# Patient Record
Sex: Female | Born: 1965 | Hispanic: Yes | Marital: Married | Smoking: Never smoker
Health system: Southern US, Community
[De-identification: ages and names within clinical notes are randomized; demographics above are authoritative.]

## PROBLEM LIST (undated history)

## (undated) DIAGNOSIS — Z780 Asymptomatic menopausal state: Secondary | ICD-10-CM

## (undated) DIAGNOSIS — J45909 Unspecified asthma, uncomplicated: Secondary | ICD-10-CM

## (undated) HISTORY — DX: Unspecified asthma, uncomplicated: J45.909

## (undated) HISTORY — PX: ABDOMINOPLASTY: SUR9

## (undated) HISTORY — DX: Asymptomatic menopausal state: Z78.0

---

## 2012-02-10 ENCOUNTER — Other Ambulatory Visit (HOSPITAL_COMMUNITY): Payer: Self-pay | Admitting: Vascular Surgery

## 2012-02-10 ENCOUNTER — Ambulatory Visit (HOSPITAL_COMMUNITY)
Admission: RE | Admit: 2012-02-10 | Discharge: 2012-02-10 | Disposition: A | Discharge status: Home / Self Care | Payer: Managed Care, Other (non HMO) | Source: Ambulatory Visit | Attending: Internal Medicine | Admitting: Internal Medicine

## 2012-02-10 DIAGNOSIS — R42 Dizziness and giddiness: Secondary | ICD-10-CM

## 2012-02-10 DIAGNOSIS — R55 Syncope and collapse: Secondary | ICD-10-CM

## 2012-02-10 NOTE — Progress Notes (Signed)
 [facility]   2D echo completed 02/10/2012.   Veda Canning, RDCS

## 2012-02-10 NOTE — Progress Notes (Signed)
Carotid Doppler completed. Olivia Maynard

## 2012-03-11 ENCOUNTER — Telehealth: Payer: Self-pay | Admitting: Genetic Counselor

## 2012-03-11 NOTE — Telephone Encounter (Signed)
S/W pt in re genetic appt 02/24 @ 1 w/Karen [employer]ell [employer].  Welcome packet mailed.

## 2012-04-03 ENCOUNTER — Other Ambulatory Visit: Payer: Self-pay | Admitting: Internal Medicine

## 2012-04-03 DIAGNOSIS — R5383 Other fatigue: Secondary | ICD-10-CM

## 2012-04-07 ENCOUNTER — Ambulatory Visit
Admission: RE | Admit: 2012-04-07 | Discharge: 2012-04-07 | Disposition: A | Discharge status: Home / Self Care | Payer: Managed Care, Other (non HMO) | Source: Ambulatory Visit | Attending: Internal Medicine | Admitting: Internal Medicine

## 2012-04-07 DIAGNOSIS — R5383 Other fatigue: Secondary | ICD-10-CM

## 2012-05-01 ENCOUNTER — Ambulatory Visit (HOSPITAL_BASED_OUTPATIENT_CLINIC_OR_DEPARTMENT_OTHER): Payer: Managed Care, Other (non HMO) | Admitting: Genetic Counselor

## 2012-05-01 ENCOUNTER — Other Ambulatory Visit: Payer: Managed Care, Other (non HMO)

## 2012-05-01 DIAGNOSIS — Z809 Family history of malignant neoplasm, unspecified: Secondary | ICD-10-CM

## 2012-05-01 DIAGNOSIS — Z803 Family history of malignant neoplasm of breast: Secondary | ICD-10-CM

## 2012-05-02 ENCOUNTER — Encounter: Payer: Self-pay | Admitting: Genetic Counselor

## 2012-05-02 NOTE — Progress Notes (Signed)
Dr.  Juliene Pina requested a consultation for genetic counseling and risk assessment for Olivia Maynard, a 47 y.o. female, for discussion of her family history of breast, throat and prostate cancer. She presents to clinic today to discuss the possibility of a genetic predisposition to cancer, and to further clarify her risks, as well as her family members' risks for cancer.   HISTORY OF PRESENT ILLNESS: Olivia Maynard is a 47 y.o. female with no personal history of cancer.    History reviewed. No pertinent past medical history.  History reviewed. No pertinent past surgical history.  History  Substance Use Topics  . Smoking status: Never Smoker   . Smokeless tobacco: Not on file  . Alcohol Use: Yes     Comment: socially    REPRODUCTIVE HISTORY AND PERSONAL RISK ASSESSMENT FACTORS: Menarche was at age 27.   Premenopausal Uterus Intact: Yes Ovaries Intact: Yes G2P2A0 , first live birth at age 74  She has previously undergone treatment for infertility.   OCP use for 2 years   She has not used HRT in the past.    FAMILY HISTORY:  We obtained a detailed, 4-generation family history.  Significant diagnoses are listed below: Family History  Problem Relation Age of Onset  . Breast cancer Sister 78  . Cancer Sister     cancer of sternum, and one other unknown cancer  . Breast cancer Paternal Aunt 40  . Prostate cancer Paternal Grandfather   . Throat cancer Other     dx late 20s-30s  . Breast cancer Paternal Aunt     dx in her 32s; bilateral cancer  . Breast cancer Paternal Aunt   . Breast cancer Paternal Aunt 47  . Breast cancer Cousin     dx in her 82s; paternal cousin  The patient has one maternal half brother and sister, and three full sisters and two full brothers.  Her oldest full sister was diagnosed with breast cancer at age 77.  She is currently 7 and since that time she has also had a cancer on her sternum and one other cancer.  The patients parents are both alive and cancer  free.  Her father had four sisters, all of whom had breast cancer.  One had breast cancer in her 19s and died at 34, another had breast cancer diagnosed twice in her 30s and died in her 68s, a third had breast cancer in her 57s and died and the fourth had breast cancer at 29 and is living.  This last sister had a daughter with breast cancer in her 45s.  The patient's paternal grandfather had prostate cancer and died at 33.  The patient's mother had a full sister and two maternal half brothers.  One brother had an unknown cancer and the sister had a daughter who is currently affected with an unknown cancer.  There is no other cancer reported in the family.  Patient's maternal ancestors are of [place] descent, and paternal ancestors are of Spanish descent. There is no reported Ashkenazi Jewish ancestry. There is no  known consanguinity.  GENETIC COUNSELING RISK ASSESSMENT, DISCUSSION, AND SUGGESTED FOLLOW UP: We reviewed the natural history and genetic etiology of sporadic, familial and hereditary cancer syndromes.  About 5-10% of breast cancer is hereditary.  Of this, about 85% is the result of a BRCA1 or BRCA2 mutation.  We reviewed the red flags of hereditary cancer syndromes and the dominant inheritance patterns.  If the BRCA testing is negative, we discussed that  we could be testing for the wrong gene.  We discussed gene panels, and that several cancer genes that are associated with different cancers can be tested at the same time.  Because of the multiple individuals in the family with breast cancer, we will consider one of the panel tests if she is negative for BRCA mutations. We discussed that if the patient tests negative, her sister needs to consider having her own testing.  The patient's family history of breast and prostate cancer is suggestive of the following possible diagnosis: hereditary cancer syndrome  We discussed that identification of a hereditary cancer syndrome may help her care  providers tailor the patients medical management. If a mutation indicating a hereditary cancer syndrome is detected in this case, the [employer] recommendations would include increased cancer surveillance and possible prophylactic surgery. If a mutation is detected, the patient will be referred back to the referring provider and to any additional appropriate care providers to discuss the relevant options.   If a mutation is not found in the patient, cancer surveillance options would be discussed for the patient according to the appropriate standard National Comprehensive Cancer Network and American Cancer Society guidelines, with consideration of their personal and family history risk factors. In this case, the patient will be referred back to their care providers for discussions of management.   In order to estimate her chance of having a BRCA mutation, we used statistical models (Tyrer Cusik and [employer]) and laboratory data that take into account her personal medical history, family history and ancestry.  Because each model is different, there can be a lot of variability in the risks they give.  Therefore, these numbers must be considered a rough range and not a precise risk of having a BRCA mutation.  These models estimate that she has approximately a 18% chance of having a mutation. Based on this assessment of her family and personal history, genetic testing is recommended.  Based on the patient's personal and family history, statistical models (Tyrer Cusik)  and literature data were used to estimate her risk of developing breast cancer. These estimate her lifetime risk of developing breast cancer to be approximately 35.1%. This estimation does not take into account any genetic testing results.   After considering the risks, benefits, and limitations, the patient provided informed consent for  the following  Testing: Breast/Ovarian cancer panel through [facility].   Per the  patient's request, we will contact her by telephone to discuss these results. A follow up genetic counseling visit will be scheduled if indicated.  The patient was seen for a total of 60 minutes, greater than 50% of which was spent face-to-face counseling.  This plan is being carried out per Dr. Camillia Herter recommendations.  This note will also be sent to the referring provider via the electronic medical record. The patient will be supplied with a summary of this genetic counseling discussion as well as educational information on the discussed hereditary cancer syndromes following the conclusion of their visit.   Patient was discussed with Dr. Drue Second.    _______________________________________________________________________ For Office Staff:  Number of people involved in session: 2 Was an Intern/ student involved with case: no

## 2012-06-15 ENCOUNTER — Telehealth: Payer: Self-pay | Admitting: Genetic Counselor

## 2012-06-15 NOTE — Telephone Encounter (Signed)
Revealed negative genetic testing.    

## 2012-06-22 ENCOUNTER — Encounter: Payer: Self-pay | Admitting: Genetic Counselor

## 2013-08-13 ENCOUNTER — Encounter (HOSPITAL_COMMUNITY): Payer: Self-pay | Admitting: Emergency Medicine

## 2013-08-13 ENCOUNTER — Emergency Department (HOSPITAL_COMMUNITY)
Admission: EM | Admit: 2013-08-13 | Discharge: 2013-08-13 | Disposition: A | Discharge status: Home / Self Care | Payer: Managed Care, Other (non HMO) | Attending: Emergency Medicine | Admitting: Emergency Medicine

## 2013-08-13 DIAGNOSIS — Y9289 Other specified places as the place of occurrence of the external cause: Secondary | ICD-10-CM | POA: Insufficient documentation

## 2013-08-13 DIAGNOSIS — M549 Dorsalgia, unspecified: Secondary | ICD-10-CM

## 2013-08-13 DIAGNOSIS — Y9389 Activity, other specified: Secondary | ICD-10-CM | POA: Insufficient documentation

## 2013-08-13 DIAGNOSIS — IMO0002 Reserved for concepts with insufficient information to code with codable children: Secondary | ICD-10-CM | POA: Insufficient documentation

## 2013-08-13 DIAGNOSIS — X500XXA Overexertion from strenuous movement or load, initial encounter: Secondary | ICD-10-CM | POA: Insufficient documentation

## 2013-08-13 MED ORDER — HYDROMORPHONE HCL PF 1 MG/ML IJ SOLN
1.0000 mg | Freq: Once | INTRAMUSCULAR | Status: AC
  Administered 2013-08-13: 1 mg via INTRAMUSCULAR
  Filled 2013-08-13: qty 1

## 2013-08-13 MED ORDER — DIAZEPAM 5 MG PO TABS
5.0000 mg | ORAL_TABLET | Freq: Once | ORAL | Status: AC
  Administered 2013-08-13: 5 mg via ORAL
  Filled 2013-08-13: qty 1

## 2013-08-13 MED ORDER — ONDANSETRON 4 MG PO TBDP
4.0000 mg | ORAL_TABLET | Freq: Once | ORAL | Status: AC
  Administered 2013-08-13: 4 mg via ORAL
  Filled 2013-08-13: qty 1

## 2013-08-13 MED ORDER — HYDROCODONE-ACETAMINOPHEN 5-325 MG PO TABS: ORAL_TABLET | ORAL | Status: DC

## 2013-08-13 MED ORDER — METHOCARBAMOL 500 MG PO TABS: 1000.0000 mg | ORAL_TABLET | Freq: Four times a day (QID) | ORAL | Status: DC

## 2013-08-13 MED ORDER — NAPROXEN 500 MG PO TABS: 500.0000 mg | ORAL_TABLET | Freq: Two times a day (BID) | ORAL | Status: DC

## 2013-08-13 NOTE — ED Notes (Signed)
Josh PA bedside 

## 2013-08-13 NOTE — ED Notes (Signed)
Pt was on church on last Sunday and sat down and felt something pop in back, was able to tolerate this past week with ibuprofen but this morning she was bending down to pick up a basket and felt something pop again and brought her down to her knees, has tried taking meds without relief.

## 2013-08-13 NOTE — ED Notes (Signed)
Pt alert, nad, resp even unlabored, skin pwd, denies needs, ambulates to discharge 

## 2013-08-13 NOTE — ED Provider Notes (Signed)
CSN: [id number]     Arrival date & time 08/13/13  [id number] History   First MD Initiated Contact with Patient 08/13/13 1009     Chief Complaint  Patient presents with  . Back Pain     (Consider location/radiation/quality/duration/timing/severity/associated sxs/prior Treatment) HPI Comments: Patient presents with complaint of lower back pain. Patient began having back pain 8 days ago when she felt a pop after sitting down in church. Patient had mild pain throughout the week controlled with Advil. This morning the patient's bent over to pick up some laundry when she felt another pop and had acute onset of severe pain. Pain radiates from her lower back around her sides. She does not have radiation into her legs. No red flags of lower back pain. She took Advil this morning without relief. The onset of this condition was acute. The course is constant. Aggravating factors: movement. Alleviating factors: none.    Patient is a 48 y.o. female presenting with back pain. The history is provided by the patient.  Back Pain Associated symptoms: no dysuria, no fever, no numbness, no pelvic pain and no weakness     History reviewed. No pertinent past medical history. History reviewed. No pertinent past surgical history. Family History  Problem Relation Age of Onset  . Breast cancer Sister 6817  . Cancer Sister     cancer of sternum, and one other unknown cancer  . Breast cancer Paternal Aunt 40  . Prostate cancer Paternal Grandfather   . Throat cancer Other     dx late 20s-30s  . Breast cancer Paternal Aunt     dx in her 6850s; bilateral cancer  . Breast cancer Paternal Aunt   . Breast cancer Paternal Aunt 975  . Breast cancer Cousin     dx in her 2750s; paternal cousin   History  Substance Use Topics  . Smoking status: Never Smoker   . Smokeless tobacco: Not on file  . Alcohol Use: Yes     Comment: socially   OB History   Grav Para Term Preterm Abortions TAB SAB Ect Mult Living                  Review of Systems  Constitutional: Negative for fever and unexpected weight change.  Gastrointestinal: Negative for constipation.       Negative for fecal incontinence.   Genitourinary: Negative for dysuria, hematuria, flank pain, vaginal bleeding, vaginal discharge and pelvic pain.       Negative for urinary incontinence or retention.  Musculoskeletal: Positive for back pain.  Neurological: Negative for weakness and numbness.       Denies saddle paresthesias.   Allergies  Review of patient's allergies indicates no known allergies.  Home Medications   Prior to Admission medications   Not on File   BP 120/73  Pulse 80  Temp(Src) 97.7 F (36.5 C) (Oral)  Resp 20  SpO2 100%  LMP 07/12/2013  Physical Exam  Nursing note and vitals reviewed. Constitutional: She appears well-developed and well-nourished. She appears distressed.  Patient crying in pain  HENT:  Head: Normocephalic and atraumatic.  Eyes: Conjunctivae are normal.  Neck: Normal range of motion. Neck supple.  Pulmonary/Chest: Effort normal.  Abdominal: Soft. There is no tenderness. There is no CVA tenderness.  Musculoskeletal: Normal range of motion. She exhibits tenderness.       Cervical back: Normal.       Thoracic back: Normal.       Lumbar back: She exhibits tenderness. She  exhibits normal range of motion and no bony tenderness.  No step-off noted with palpation of spine.   Neurological: She is alert. She has normal strength and normal reflexes. No sensory deficit.  5/5 strength in entire lower extremities bilaterally. No sensation deficit.   Skin: Skin is warm and dry. No rash noted.  Psychiatric: She has a normal mood and affect.    ED Course  Procedures (including critical care time) Labs Review Labs Reviewed - No data to display  Imaging Review No results found.   EKG Interpretation None      10:35 AM Patient seen and examined. Work-up initiated. Given severity of symptoms, IM dilaudid and  PO valium ordered. Medications ordered.   Vital signs reviewed and are as follows: Filed Vitals:   08/13/13 0956  BP: 120/73  Pulse: 80  Temp: 97.7 F (36.5 C)  Resp: 20   11:31 AM Symptoms much improved. LE neurovascularly intact with normal strength and sensation.   No red flag s/s of low back pain. Patient was counseled on back pain precautions and told to do activity as tolerated but do not lift, push, or pull heavy objects more than 10 pounds for the next week.  Patient counseled to use ice or heat on back for no longer than 15 minutes every hour.   Patient prescribed muscle relaxer and counseled on proper use of muscle relaxant medication.    Patient prescribed narcotic pain medicine and counseled on proper use of narcotic pain medications. Counseled not to combine this medication with others containing tylenol.   Urged patient not to drink alcohol, drive, or perform any other activities that requires focus while taking either of these medications.  Patient urged to follow-up with PCP if pain does not improve with treatment and rest or if pain becomes recurrent. Urged to return with worsening severe pain, loss of bowel or bladder control, trouble walking.   The patient verbalizes understanding and agrees with the plan.   MDM   Final diagnoses:  Back pain   Patient with back pain. No neurological deficits. Patient is ambulatory. No warning symptoms of back pain including: loss of bowel or bladder control, night sweats, waking from sleep with back pain, unexplained fevers or weight loss, h/o cancer, IVDU, recent trauma. No concern for cauda equina, epidural abscess, or other serious cause of back pain. Conservative measures such as rest, ice/heat and pain medicine indicated with PCP follow-up if no improvement with conservative management.     Renne Crigler, PA-C 08/13/13 1132

## 2013-08-13 NOTE — Discharge Instructions (Signed)
Please read and follow all provided instructions.  Your diagnoses today include:  1. Back pain     Tests performed today include:  Vital signs - see below for your results today  Medications prescribed:   Vicodin (hydrocodone/acetaminophen) - narcotic pain medication  DO NOT drive or perform any activities that require you to be awake and alert because this medicine can make you drowsy. BE VERY CAREFUL not to take multiple medicines containing Tylenol (also called acetaminophen). Doing so can lead to an overdose which can damage your liver and cause liver failure and possibly death.   Robaxin (methocarbamol) - muscle relaxer medication  DO NOT drive or perform any activities that require you to be awake and alert because this medicine can make you drowsy.    Naproxen - anti-inflammatory pain medication  Do not exceed 500mg  naproxen every 12 hours, take with food  You have been prescribed an anti-inflammatory medication or NSAID. Take with food. Take smallest effective dose for the shortest duration needed for your pain. Stop taking if you experience stomach pain or vomiting.   Take any prescribed medications only as directed.  Home care instructions:   Follow any educational materials contained in this packet  Please rest, use ice or heat on your back for the next several days  Do not lift, push, pull anything more than 10 pounds for the next week  Follow-up instructions: Please follow-up with your primary care provider in the next 1 week for further evaluation of your symptoms. If you do not have a primary care doctor -- see below for referral information.   Return instructions:  SEEK IMMEDIATE MEDICAL ATTENTION IF YOU HAVE:  New numbness, tingling, weakness, or problem with the use of your arms or legs  Severe back pain not relieved with medications  Loss control of your bowels or bladder  Increasing pain in any areas of the body (such as chest or abdominal  pain)  Shortness of breath, dizziness, or fainting.   Worsening nausea (feeling sick to your stomach), vomiting, fever, or sweats  Any other emergent concerns regarding your health   Additional Information:  Your vital signs today were: BP 120/73   Pulse 80   Temp(Src) 97.7 F (36.5 C) (Oral)   Resp 20   SpO2 100%   LMP 07/12/2013 If your blood pressure (BP) was elevated above 135/85 this visit, please have this repeated by your doctor within one month. --------------

## 2013-08-13 NOTE — ED Provider Notes (Signed)
Medical screening examination/treatment/procedure(s) were performed by non-physician practitioner and as supervising physician I was immediately available for consultation/collaboration.   EKG Interpretation None        Elverna Caffee F Curran Lenderman, MD 08/13/13 1611 

## 2013-11-04 IMAGING — US US RENAL
1 series · 14 of 25 positions shown · non-contrast
Comparison: None.

RENAL/URINARY TRACT ULTRASOUND

CLINICAL DATA: Fatigue

RENAL/URINARY TRACT ULTRASOUND
RENAL DUPLEX ULTRASOUND
TECHNIQUE: Routine ultrasound examination of the kidneys and
urinary tract was performed.  Duplex and color Doppler ultrasound
was utilized to evaluate blood flow in the renal arteries and
kidneys.

[Series 1: us renal · 14 of 82 slices shown]
[im 1/82]
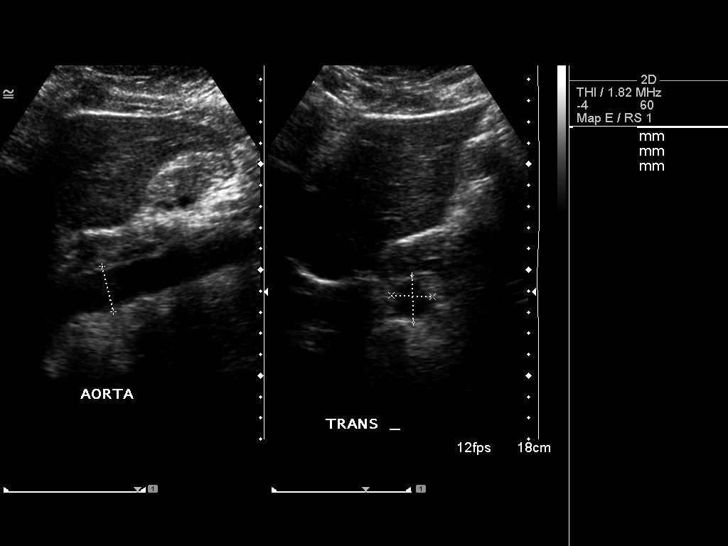
[im 7/82]
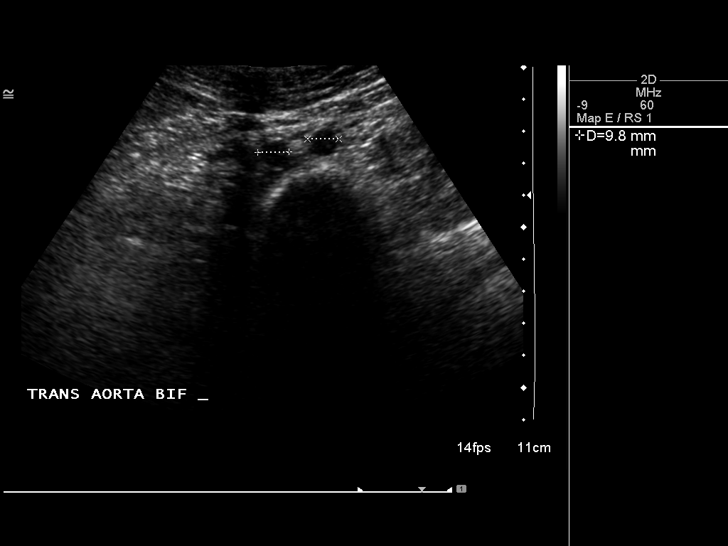
[im 14/82]
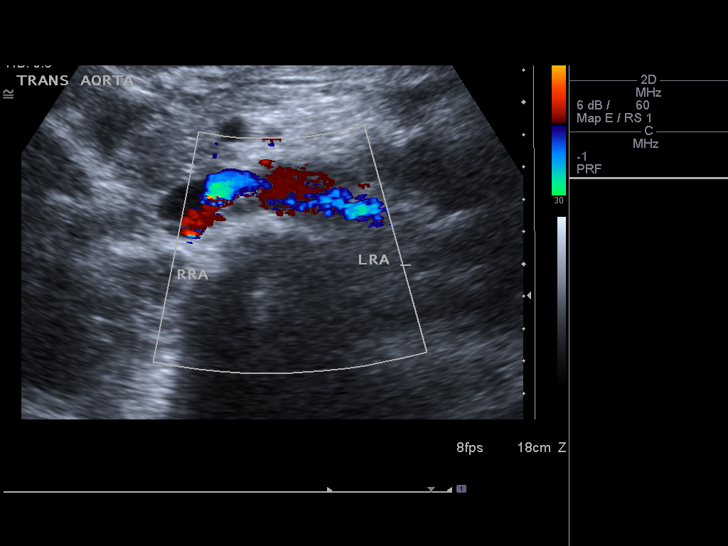
[im 21/82]
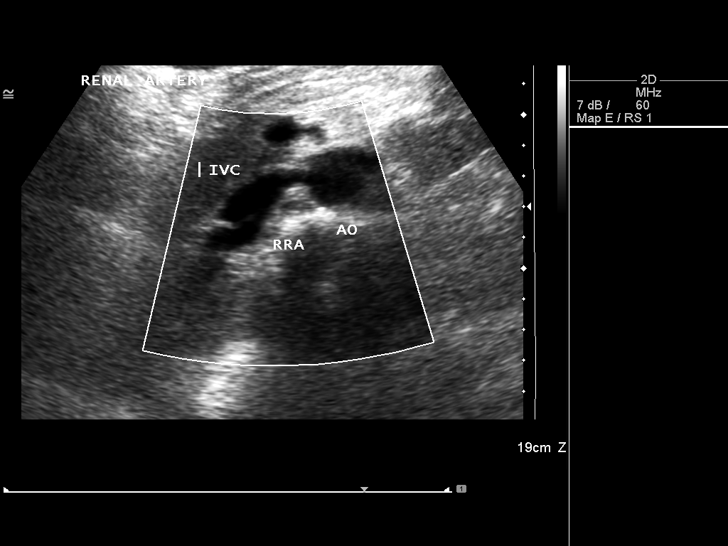
[im 28/82]
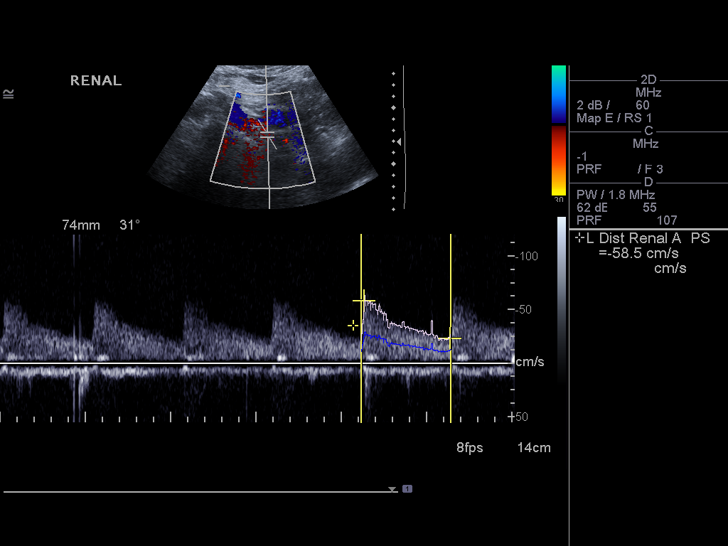
[im 31/82]
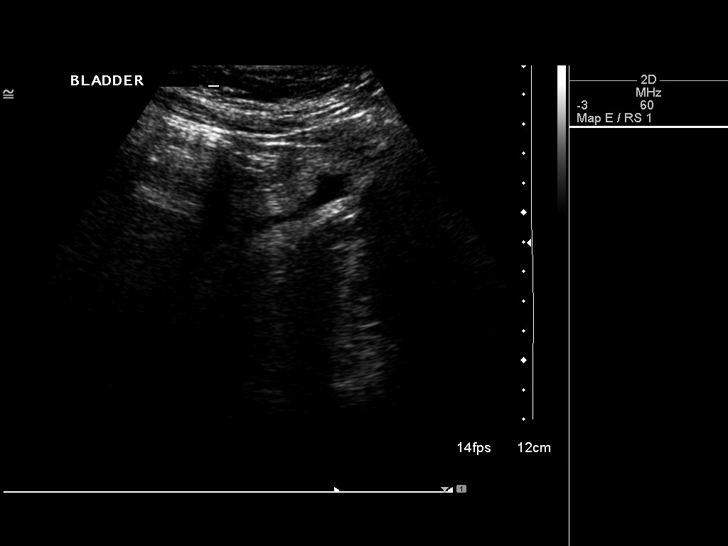
[im 38/82]
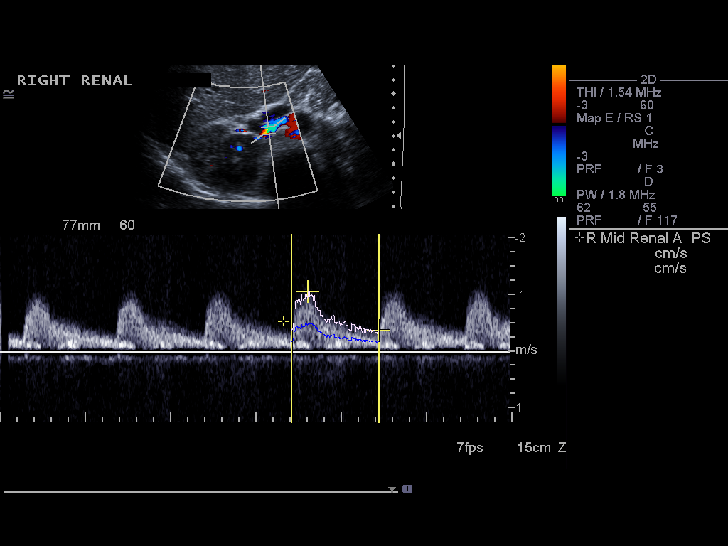
[im 44/82]
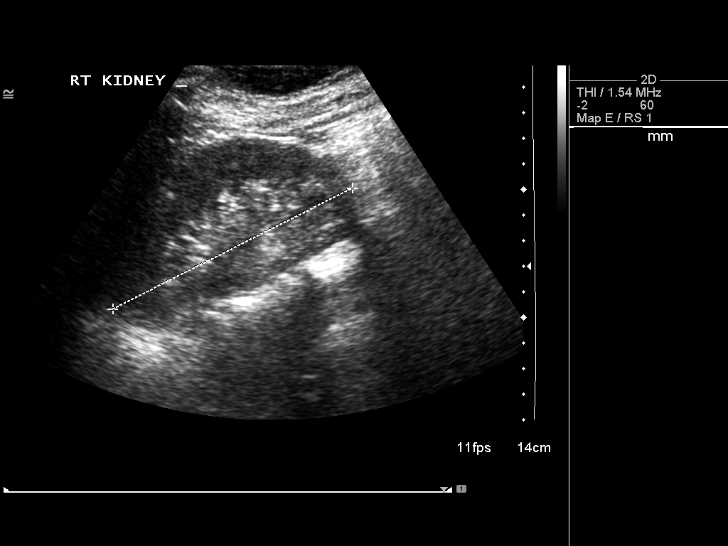
[im 51/82]
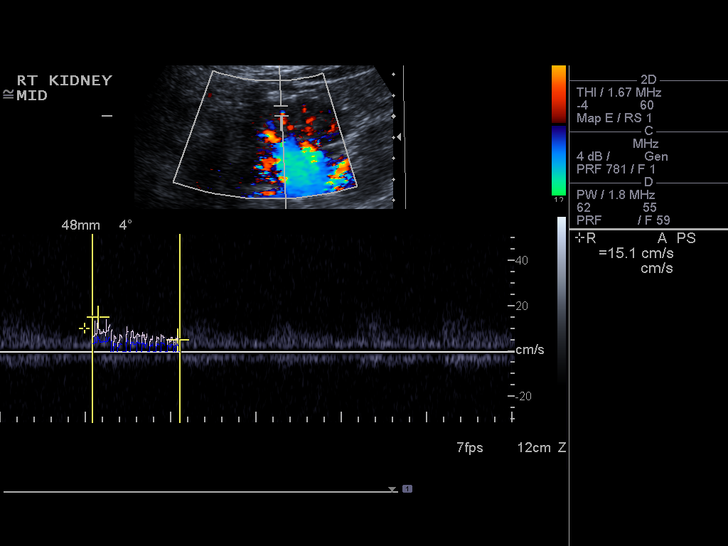
[im 55/82]
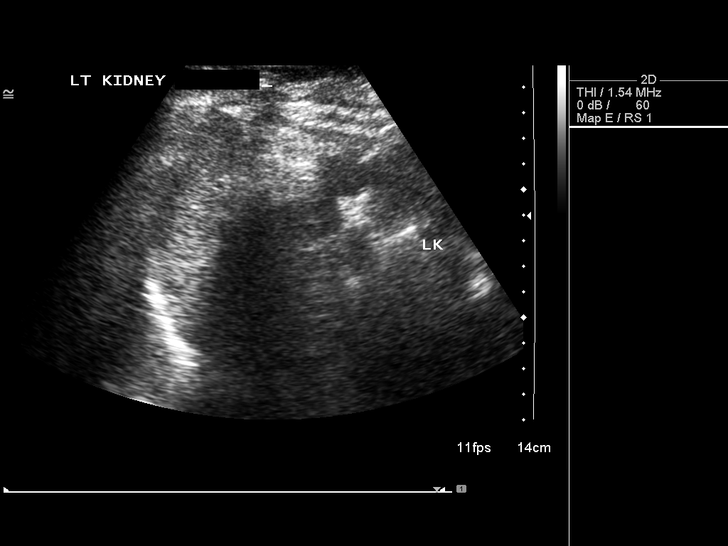
[im 61/82]
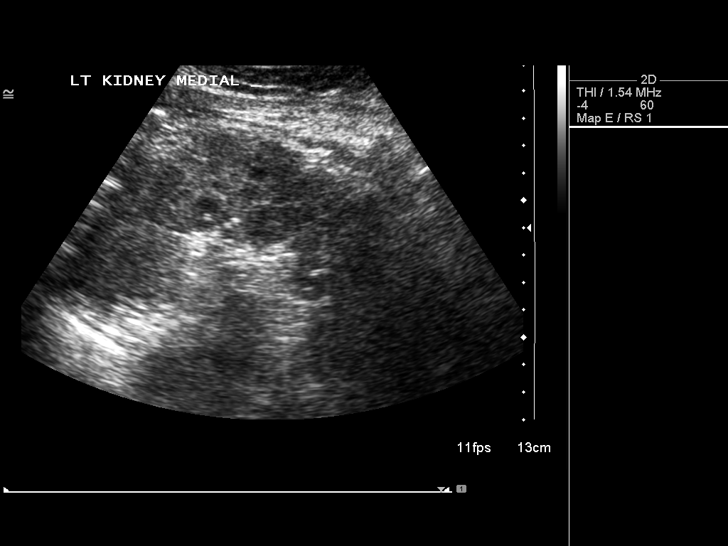
[im 68/82]
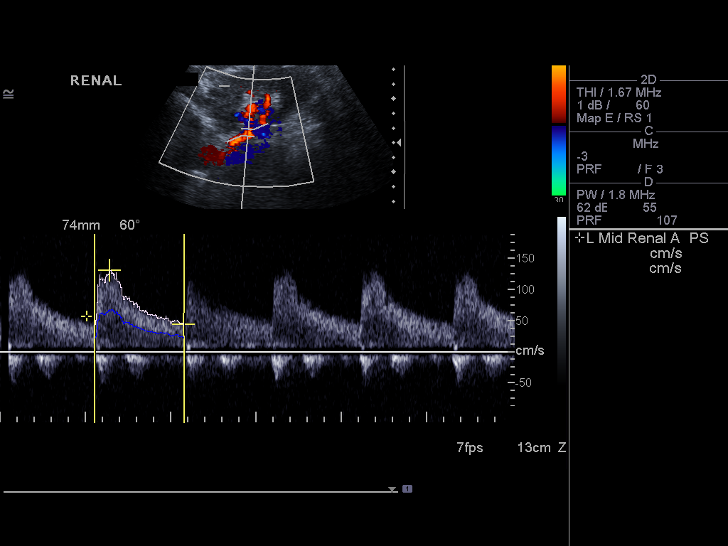
[im 75/82]
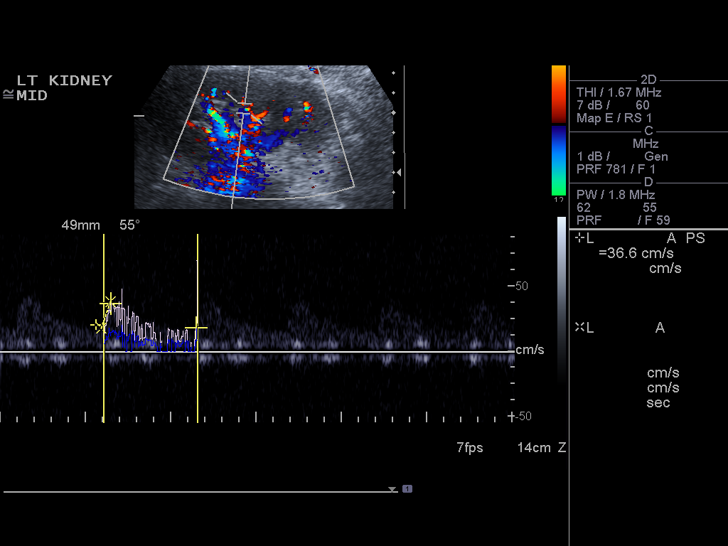
[im 82/82]
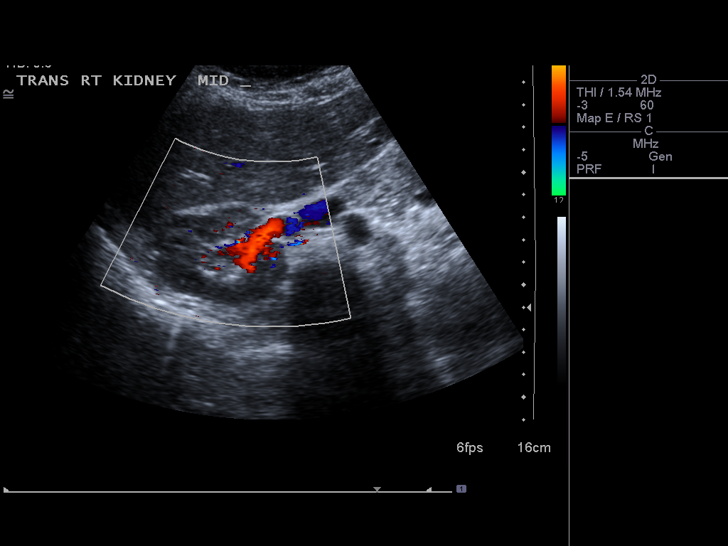

[14 of 25 positions shown; findings below may reference images not displayed]

FINDINGS: Right Kidney:  10.5 cm in length.  No hydronephrosis or mass.

Left Kidney:  11.1 cm in length.  No hydronephrosis or mass.

Bladder:  Decompressed.

RENAL DUPLEX ULTRASOUND

Right Renal Artery Velocities

Origin: 95 cm/sec
Mid: 105 cm/sec
Hilum: 139 cm/sec
Interlobar: 63 cm/sec
Arcuate: 36 cm/sec

Left Renal Artery Velocities

Origin: 79 cm/sec
Mid: 130 cm/sec
Hilum: 89 cm/sec
Interlobar: 54 cm/sec
Arcuate: 25 cm/sec

Aortic Velocity: 97 cm/sec

Right Renal Aortic Ratios

Origin:
Mid:
Hilum:
Interlobar:
Arcuate:

Left Renal Aortic Ratios

Origin:
Mid:
Hilum:
Interlobar:
Arcuate:0.26
FINDINGS: Renal veins are patent bilaterally by color flow and
Doppler analysis.
IMPRESSION: No evidence of renal artery stenosis.  No acute urinary pathology.

## 2021-05-26 ENCOUNTER — Other Ambulatory Visit: Payer: Self-pay

## 2021-05-26 ENCOUNTER — Ambulatory Visit: Payer: Managed Care, Other (non HMO) | Admitting: Cardiology

## 2021-05-26 ENCOUNTER — Encounter: Payer: Self-pay | Admitting: Cardiology

## 2021-05-26 VITALS — BP 127/83 | HR 88 | Temp 97.2°F | Resp 16 | Ht 62.0 in | Wt 147.0 lb

## 2021-05-26 DIAGNOSIS — R002 Palpitations: Secondary | ICD-10-CM

## 2021-05-26 NOTE — Progress Notes (Signed)
? ?Date:  05/26/2021  ? ?IDSumeya Maynard, DOB [birth year], MRN [id number] ? ?PCP:  Georgianne Fick, MD  ?Cardiologist:  Tessa Lerner, DO, [facility] (established care 05/26/2021) ? ?REASON FOR CONSULT: Palpitations ? ?REQUESTING PHYSICIAN:  ?Georgianne Fick, MD ?[address] ?[place],  [place] [address] ? ?Chief Complaint  ?Patient presents with  ? New Patient (Initial Visit)  ? Palpitations  ? ? ?HPI  ?Olivia Maynard is a 56 y.o.  female who presents to the office with a chief complaint of " palpitations." Patient's past medical history and cardiovascular risk factors include: HRT (testosterone/estrogen), asthma.  ? ?She is referred to the office at the request of Georgianne Fick, MD for evaluation of palpitation. ? ?Palpitations: ?Patient states that she been experiencing palpitations at rest (predominantly when she is sitting or laying down), present since November 2022, has not increased in intensity, frequency, and/or duration.  Symptoms occur randomly, last for few seconds, self-limited.  No improving or worsening factors.  Patient has not taken any medications or supplements for the current symptoms. ? 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 ? ?No consumption of tea, excessive alcohol, no weight loss supplements, or stimulants.  She consumes 1 cup of coffee on a daily basis.  And does consume an energy drink after her workout on average daily.  ? ?Approximately 10 years ago when she was working in [place] she had a minor episode of syncope she was evaluated in the hospital but no unifying diagnoses.  And since then no recurrence of near syncope or syncope. ? ?FUNCTIONAL STATUS: ?Goes to the gym 4 days a week for 45 minutes.  ? ?ALLERGIES: ?No Known Allergies ? ?MEDICATION LIST PRIOR TO VISIT: ?No outpatient medications have been marked as taking  for the 05/26/21 encounter (Office Visit) with Tessa Lerner, DO.  ?  ? ?PAST MEDICAL HISTORY: ?Past Medical History:  ?Diagnosis Date  ? Asthma   ? Menopause   ? ? ?PAST SURGICAL HISTORY: ?Past Surgical History:  ?Procedure Laterality Date  ? ABDOMINOPLASTY    ? ? ?FAMILY HISTORY: ?The patient family history includes Breast cancer in her cousin, paternal aunt, and paternal aunt; Breast cancer (age of onset: 22) in her sister; Breast cancer (age of onset: 28) in her paternal aunt; Breast cancer (age of onset: 38) in her paternal aunt; Cancer in her sister; Heart attack in her paternal grandfather and paternal grandmother; Prostate cancer in her paternal grandfather; Throat cancer in an other family member. ? ?SOCIAL HISTORY:  ?The patient  reports that she has never smoked. She does not have any smokeless tobacco history on file. She reports current alcohol use. She reports that she does not use drugs. ? ?REVIEW OF SYSTEMS: ?Review of Systems  ?Cardiovascular:  Positive for palpitations. Negative for chest pain, dyspnea on exertion, leg swelling, near-syncope, orthopnea, paroxysmal nocturnal dyspnea and syncope.  ?Respiratory:  Negative for shortness of breath.   ? ?PHYSICAL EXAM: ?Vitals with BMI 05/26/2021 08/13/2013 08/13/2013  ?Height 5\' 2"  - -  ?Weight 147 lbs - -  ?BMI 26.88 - -  ?Systolic 127 128  ?Diastolic 83 76 73  ?Pulse 88 72 80  ? ? ?CONSTITUTIONAL: Well-developed and well-nourished. No acute distress.  ?SKIN: Skin is warm and dry. No rash noted. No cyanosis. No pallor. No jaundice ?HEAD: Normocephalic and atraumatic.  ?EYES:  No scleral icterus ?MOUTH/THROAT: Moist oral membranes.  ?NECK: No JVD present. No thyromegaly noted. No carotid bruits  ?LYMPHATIC: No visible cervical adenopathy.  ?CHEST Normal respiratory effort. No intercostal retractions  ?LUNGS: Clear to auscultation bilaterally.  No stridor. No wheezes. No rales.  ?CARDIOVASCULAR: Regular rate and rhythm, positive S1-S2, no murmurs rubs or  gallops appreciated. ?ABDOMINAL: Soft, nontender, nondistended, positive bowel sounds in all 4 quadrants, no apparent ascites.  ?EXTREMITIES: No peripheral edema, warm to touch, 2+ bilateral DP and PT pulses ?HEMATOLOGIC: No significant bruising ?NEUROLOGIC: Oriented to person, place, and time. Nonfocal. Normal muscle tone.  ?PSYCHIATRIC: Normal mood and affect. Normal behavior. Cooperative ? ?CARDIAC DATABASE: ?EKG: ?05/26/2021: NSR, 70 bpm, normal axis, LAE, without underlying ischemia or injury pattern.  ? ?Echocardiogram: ?No results found for this or any previous visit from the past 1095 days. ?  ? ?Stress Testing: ?No results found for this or any previous visit from the past 1095 days. ? ? ?Heart Catheterization: ?None ? ?LABORATORY DATA: ?External Labs: ?Collected: 04/27/2021. ?Total cholesterol 195, triglycerides 107, HDL 70, LDL 104, non-HDL 125. ?TSH 1.93. ?Hemoglobin 15.2 g/dL, hematocrit 16%. ?Sodium 140, potassium 4.1, chloride 104, bicarb 24, BUN 15, creatinine 0.8. ?AST 22, ALT 26, alkaline phosphatase 70 ? ? ?IMPRESSION: ? ?  ICD-10-CM   ?1. Palpitations  R00.2 EKG 12-Lead  ?  LONG TERM MONITOR (3-14 DAYS)  ?  ?  ? ?RECOMMENDATIONS: ?Olivia Maynard is a 56 y.o. Hispanic female whose past medical history and cardiac risk factors include: Hormone replacement therapy, asthma. ? ?Palpitations ?No identifiable reversible causes. ?TSH within normal limits. ?Hemoglobin within normal limits. ?EKG normal sinus rhythm without underlying ischemia or injury pattern. ?Currently on hormone replacement therapy-I have asked her to discuss her symptoms of palpitations with her prescribing physician to see if there is correlation. 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  Patient states that it may be secondary to anxiety and will discuss with PCP. ?In the meantime recommend at least a 14-day extended  Holter monitor to evaluate for any dysrhythmias and/or atrial fibrillation. ?Follow-up visit in 6 months or sooner if change in clinical status. ?Patient is agreeable with the plan of care. ? ?As part of today's office visit reviewed office notes provided by PCP and independently reviewed EKG and labs provided by PCP and summarized above for further reference. ? ? ?FINAL MEDICATION LIST END OF ENCOUNTER: ?No orders of the defined types were placed in this encounter. ?  ?Medications Discontinued During This Encounter  ?Medication Reason  ? HYDROcodone-acetaminophen (NORCO/VICODIN) 5-325 MG per tablet   ? ibuprofen (ADVIL,MOTRIN) 200 MG tablet   ? methocarbamol (ROBAXIN) 500 MG tablet   ? naproxen (NAPROSYN) 500 MG tablet   ?  ?No current outpatient medications on file. ? ?Orders Placed This Encounter  ?Procedures  ? LONG TERM MONITOR (3-14 DAYS)  ? EKG 12-Lead  ? ? ?There are no Patient Instructions on file for this visit.  ? ?--Continue cardiac medications as reconciled in final medication list. ?--Return in about 6 months (around 11/26/2021) for Follow up, Palpitations. Or sooner if needed. ?--Continue follow-up with your primary care physician regarding the management of your other chronic comorbid conditions. ? ?Patient's questions and concerns were addressed to her satisfaction. She voices understanding of the instructions provided during this encounter.  ? ?This note was created using a voice recognition software as a  result there may be grammatical errors inadvertently enclosed that do not reflect the nature of this encounter. Every attempt is made to correct such errors. ? ?Tessa LernerSunit Rigley Niess, DO, FACC ? ?Pager: [phone] ?Office: [phone] ? ? ?

## 2021-11-26 ENCOUNTER — Ambulatory Visit: Payer: Managed Care, Other (non HMO) | Admitting: Cardiology
